# Patient Record
Sex: Female | Born: 1940 | Hispanic: No | Marital: Married | Smoking: Former smoker
Health system: Southern US, Community
[De-identification: ages and names within clinical notes are randomized; demographics above are authoritative.]

## PROBLEM LIST (undated history)

## (undated) DIAGNOSIS — E785 Hyperlipidemia, unspecified: Secondary | ICD-10-CM

## (undated) DIAGNOSIS — E079 Disorder of thyroid, unspecified: Secondary | ICD-10-CM

## (undated) HISTORY — PX: ABDOMINAL HYSTERECTOMY: SHX81

## (undated) HISTORY — PX: CHOLECYSTECTOMY: SHX55

## (undated) HISTORY — PX: TONSILLECTOMY: SUR1361

## (undated) HISTORY — PX: ADENOIDECTOMY: SUR15

## (undated) HISTORY — PX: APPENDECTOMY: SHX54

---

## 1998-11-29 ENCOUNTER — Other Ambulatory Visit: Admission: RE | Admit: 1998-11-29 | Discharge: 1998-11-29 | Payer: Self-pay | Admitting: *Deleted

## 1999-12-12 ENCOUNTER — Other Ambulatory Visit: Admission: RE | Admit: 1999-12-12 | Discharge: 1999-12-12 | Payer: Self-pay | Admitting: *Deleted

## 2001-01-15 ENCOUNTER — Other Ambulatory Visit: Admission: RE | Admit: 2001-01-15 | Discharge: 2001-01-15 | Payer: Self-pay | Admitting: *Deleted

## 2018-09-09 ENCOUNTER — Emergency Department (HOSPITAL_BASED_OUTPATIENT_CLINIC_OR_DEPARTMENT_OTHER): Payer: Medicare Other

## 2018-09-09 ENCOUNTER — Encounter (HOSPITAL_BASED_OUTPATIENT_CLINIC_OR_DEPARTMENT_OTHER): Payer: Self-pay

## 2018-09-09 ENCOUNTER — Other Ambulatory Visit: Payer: Self-pay

## 2018-09-09 ENCOUNTER — Emergency Department (HOSPITAL_BASED_OUTPATIENT_CLINIC_OR_DEPARTMENT_OTHER)
Admission: EM | Admit: 2018-09-09 | Discharge: 2018-09-09 | Disposition: A | Discharge status: Home / Self Care | Payer: Medicare Other | Attending: Emergency Medicine | Admitting: Emergency Medicine

## 2018-09-09 DIAGNOSIS — Z9049 Acquired absence of other specified parts of digestive tract: Secondary | ICD-10-CM | POA: Diagnosis not present

## 2018-09-09 DIAGNOSIS — S82144A Nondisplaced bicondylar fracture of right tibia, initial encounter for closed fracture: Secondary | ICD-10-CM | POA: Diagnosis not present

## 2018-09-09 DIAGNOSIS — Y92512 Supermarket, store or market as the place of occurrence of the external cause: Secondary | ICD-10-CM | POA: Insufficient documentation

## 2018-09-09 DIAGNOSIS — Y9389 Activity, other specified: Secondary | ICD-10-CM | POA: Insufficient documentation

## 2018-09-09 DIAGNOSIS — W01198A Fall on same level from slipping, tripping and stumbling with subsequent striking against other object, initial encounter: Secondary | ICD-10-CM | POA: Diagnosis not present

## 2018-09-09 DIAGNOSIS — Y998 Other external cause status: Secondary | ICD-10-CM | POA: Insufficient documentation

## 2018-09-09 DIAGNOSIS — Z87891 Personal history of nicotine dependence: Secondary | ICD-10-CM | POA: Diagnosis not present

## 2018-09-09 DIAGNOSIS — S8991XA Unspecified injury of right lower leg, initial encounter: Secondary | ICD-10-CM | POA: Diagnosis present

## 2018-09-09 DIAGNOSIS — S82141A Displaced bicondylar fracture of right tibia, initial encounter for closed fracture: Secondary | ICD-10-CM

## 2018-09-09 HISTORY — DX: Hyperlipidemia, unspecified: E78.5

## 2018-09-09 HISTORY — DX: Disorder of thyroid, unspecified: E07.9

## 2018-09-09 MED ORDER — ACETAMINOPHEN 500 MG PO TABS
500.0000 mg | ORAL_TABLET | Freq: Once | ORAL | Status: AC
  Administered 2018-09-09: 500 mg via ORAL
  Filled 2018-09-09: qty 1

## 2018-09-09 NOTE — ED Triage Notes (Signed)
Pt tripped over a threshold and fell to the concrete. Pt c/o injury to R knee. Denies striking her head. Denies blood thinners.

## 2018-09-09 NOTE — Discharge Instructions (Signed)
You were seen today for pain in your right knee. Please read and follow all provided instructions.  Your CT scan today shows you have a nondisplaced posterior lateral tibial fracture without depression.  1. Medications: Tylenol for pain control, usual home medications  2. Treatment: rest, ice, elevate and use brace and crutches.  3. Follow Up: Please followup with orthopedics .  Discussed your CT scan with our on-call orthopedist Dr. Lequita Halt recommend you call the office tomorrow to schedule an outpatient appointment to be seen later this week.  He recommends you wear the knee brace, use crutches and be nonweightbearing.   Return to the emergency department for any new or worsening symptoms.  This includes pain you are unable to control, swelling to the leg or the leg feeling extremely tight.

## 2018-09-09 NOTE — ED Notes (Signed)
Patient transported to CT 

## 2018-09-09 NOTE — ED Provider Notes (Signed)
[facility] EMERGENCY DEPARTMENT Provider Note   CSN: [id number] Arrival date & time: 09/09/18  1734    History   Chief Complaint Chief Complaint  Patient presents with   Knee Injury    HPI Shannon Smith is a 78 y.o. female history of hyperlipidemia and thyroid disease presenting to emergency department today with chief complaint of right knee pain after mechanical fall.  Onset was acute happening just prior to arrival.  Patient states she was walking into a store and tripped on an unlevel curb.  She fell and landed on her right knee.  The fall was witnessed by bystanders who helped her get up.  Patient was able to ambulate into the store however had severe pain and then came in for evaluation.  She states the pain is located at her kneecap, behind it, as well as down the lateral side of her right leg.  She describes the pain as sharp and shooting.  Pain is better at rest and worse with movement.  She rates the pain 10 out of 10 with movement.  She did not take anything for pain prior to arrival.  She has superficial abrasion to base of right thumb. She denies hitting her head, LOC, use of blood thinners, right ankle pain.  History provided by patient with additional history obtained from chart review.     Past Medical History:  Diagnosis Date   Hyperlipidemia    Thyroid disease     There are no active problems to display for this patient.   Past Surgical History:  Procedure Laterality Date   ABDOMINAL HYSTERECTOMY     ADENOIDECTOMY     APPENDECTOMY     CHOLECYSTECTOMY     TONSILLECTOMY       OB History   No obstetric history on file.      Home Medications    Prior to Admission medications   Not on File    Family History No family history on file.  Social History Social History   Tobacco Use   Smoking status: Former Smoker   Smokeless tobacco: Never Used  Substance Use Topics   Alcohol use: Yes    Comment: occ   Drug use: Never      Allergies   Daypro [oxaprozin]   Review of Systems Review of Systems  Constitutional: Negative for chills and fever.  Musculoskeletal: Positive for arthralgias, gait problem and joint swelling. Negative for back pain, neck pain and neck stiffness.  Skin: Positive for wound.  Allergic/Immunologic: Negative for immunocompromised state.  Neurological: Negative for dizziness, syncope, weakness, light-headedness, numbness and headaches.     Physical Exam Updated Vital Signs BP (!) 161/82 (BP Location: Left Arm)    Pulse 87    Temp 98.5 F (36.9 C) (Oral)    Resp 20    Ht 5' 1.5" (1.562 m)    Wt 63.5 kg    SpO2 96%    BMI 26.02 kg/m   Physical Exam Vitals signs and nursing note reviewed.  Constitutional:      General: She is not in acute distress.    Appearance: She is not toxic-appearing.  HENT:     Head: Normocephalic and atraumatic.     Nose: Nose normal.  Eyes:     General: No scleral icterus.    Conjunctiva/sclera: Conjunctivae normal.  Neck:     Musculoskeletal: Normal range of motion.  Cardiovascular:     Rate and Rhythm: Normal rate and regular rhythm.  Pulses: Normal pulses.          Radial pulses are 2+ on the right side and 2+ on the left side.     Heart sounds: Normal heart sounds.  Pulmonary:     Effort: Pulmonary effort is normal.     Breath sounds: Normal breath sounds.  Abdominal:     General: There is no distension.     Palpations: Abdomen is soft.     Tenderness: There is no abdominal tenderness.  Musculoskeletal:     Right hip: Normal.     Right knee: She exhibits no deformity and normal alignment. No medial joint line, no lateral joint line, no MCL and no LCL tenderness noted.     Right ankle: Normal.     Comments: Decreased range of motion including flexion and extension of right knee likely secondary to pain. No laceration, wound, ecchymosis, abrasion noted to right lower extremity. Pt has antalgic gait. Neurovascularly intact distally.  Compartments soft above and below affected joint. There is edema to lateral aspect of right knee. No bony tenderness of patella. Intact anterior and posterior drawer tests. DP plulse 2+ bilaterally. Full ROM of right ankle, non tender to palpation, able to wiggle all toes.   Skin:    General: Skin is warm and dry.     Comments: Professional abrasion to base of left thumb palmar aspect.  Bleeding is controlled.  Neurological:     Mental Status: She is alert and oriented to person, place, and time.     Comments: Sensation grossly intact to light touch in the lower extremities bilaterally. Strength 5/5 with flexion and extension at the bilateral hips, and ankles. Strength 4/5 with flexion and extension of right knee   Psychiatric:        Behavior: Behavior normal.      ED Treatments / Results  Labs (all labs ordered are listed, but only abnormal results are displayed) Labs Reviewed - No data to display  EKG None  Radiology Ct Knee Right Wo Contrast  Result Date: 09/09/2018 CLINICAL DATA:  Status post fall today.  Unable to bear weight EXAM: CT OF THE RIGHT KNEE WITHOUT CONTRAST TECHNIQUE: Multidetector CT imaging of the RIGHT knee was performed according to the standard protocol. Multiplanar CT image reconstructions were also generated. COMPARISON:  None. FINDINGS: Bones/Joint/Cartilage Generalized osteopenia. Subtle nondisplaced fracture of the posterior lateral tibial plateau without depression. Normal alignment. Large hemarthrosis. 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 Subchondral cystic changes in the lateral patellar facet. No aggressive osseous lesion. No periosteal reaction or bone destruction. Ligaments Ligaments are suboptimally evaluated by CT. Muscles and Tendons Muscles are normal. No muscle atrophy. Quadriceps tendon and patellar tendon are intact. Soft tissue No fluid collection or hematoma. No soft tissue mass. Soft tissue edema along the  anterior aspect of the patella. IMPRESSION: 1. Subtle nondisplaced fracture of the posterior lateral tibial plateau without depression. Large hemarthrosis. Electronically Signed   By: Elige Ko   On: 09/09/2018 21:23   Dg Knee Complete 4 Views Right  Result Date: 09/09/2018 CLINICAL DATA:  RIGHT knee pain and swelling, tripped over a threshold and fell onto concrete today, unable to bear weight, limited mobility EXAM: RIGHT KNEE - COMPLETE 4+ VIEW COMPARISON:  None FINDINGS: Osseous demineralization. Joint spaces preserved. Small knee joint effusion present. No acute fracture, dislocation, or bone destruction. IMPRESSION: Osseous demineralization and small knee joint effusion. No acute fracture or dislocation seen. Electronically Signed   By: Ulyses Southward  M.D.   On: 09/09/2018 18:41    Procedures Procedures (including critical care time)  Medications Ordered in ED Medications  acetaminophen (TYLENOL) tablet 500 mg (500 mg Oral Given 09/09/18 1958)     Initial Impression / Assessment and Plan / ED Course  I have reviewed the triage vital signs and the nursing notes.  Pertinent labs & imaging results that were available during my care of the patient were reviewed by me and considered in my medical decision making (see chart for details).    78 yo female presents with right leg pain after mechanical fall landing on right knee. She is nontoxic appearing, in no acute distress. Pt had right knee xray completed in triage. I viewed the xray and it is negative for any fractures or dislocations. On my exam pt is tender to palpation lateral side of right leg and has difficulty bearing weight. She can ambulate with assistance however given the significant pain there is concern for tibial plateau fracture so CT right knee ordered. Pt declines pain medication and states she only takes Tylenol for pain. Will give PO Tylenol, ice and reassess. There are no signs of compartment syndrome. Ct scan viewed by me  shows nondisplaced fracture of the posterior lateral tibial plateau without depression and large hemarthrosis. On rexamination pt's swelling and pain have improved. Informed pt of fracture, placed in knee immobilizer. She has her own crutches here in the department. Consulted on call ortho Dr. Lequita HaltAluisio who recommends brace, non weight bearing, and calling the office tomorrow to schedule appointment to be seen later this week.   Patient is hemodynamically stable, in NAD, and able to ambulate with crutches in the ED. Evaluation does not show pathology that would require ongoing emergent intervention or inpatient treatment. I explained the diagnosis to the patient. Pain has been managed and has no complaints prior to discharge. Patient is comfortable with above plan and is stable for discharge at this time. All questions were answered prior to disposition. Strict return precautions for returning to the ED were discussed. Encouraged follow up with ortho. Pt case discussed with Dr. Dalene SeltzerSchlossman who agrees with my plan.    This note was prepared with assistance of Conservation officer, historic buildingsDragon voice recognition software. Occasional wrong-word or sound-a-like substitutions may have occurred due to the inherent limitations of voice recognition software.    Final Clinical Impressions(s) / ED Diagnoses   Final diagnoses:  Tibial plateau fracture, right, closed, initial encounter    ED Discharge Orders    None       Sherene Sireslbrizze, Rose Hippler E, PA-C 09/10/18 1229    Alvira MondaySchlossman, Erin, MD 09/10/18 1534

## 2020-03-10 IMAGING — DX RIGHT KNEE - COMPLETE 4+ VIEW
4 series · 4 of 4 positions shown · non-contrast
Comparison: None

CLINICAL DATA: RIGHT knee pain and swelling, tripped over a
threshold and fell onto concrete today, unable to bear weight,
limited mobility

EXAM:
RIGHT KNEE - COMPLETE 4+ VIEW

[knee ap]
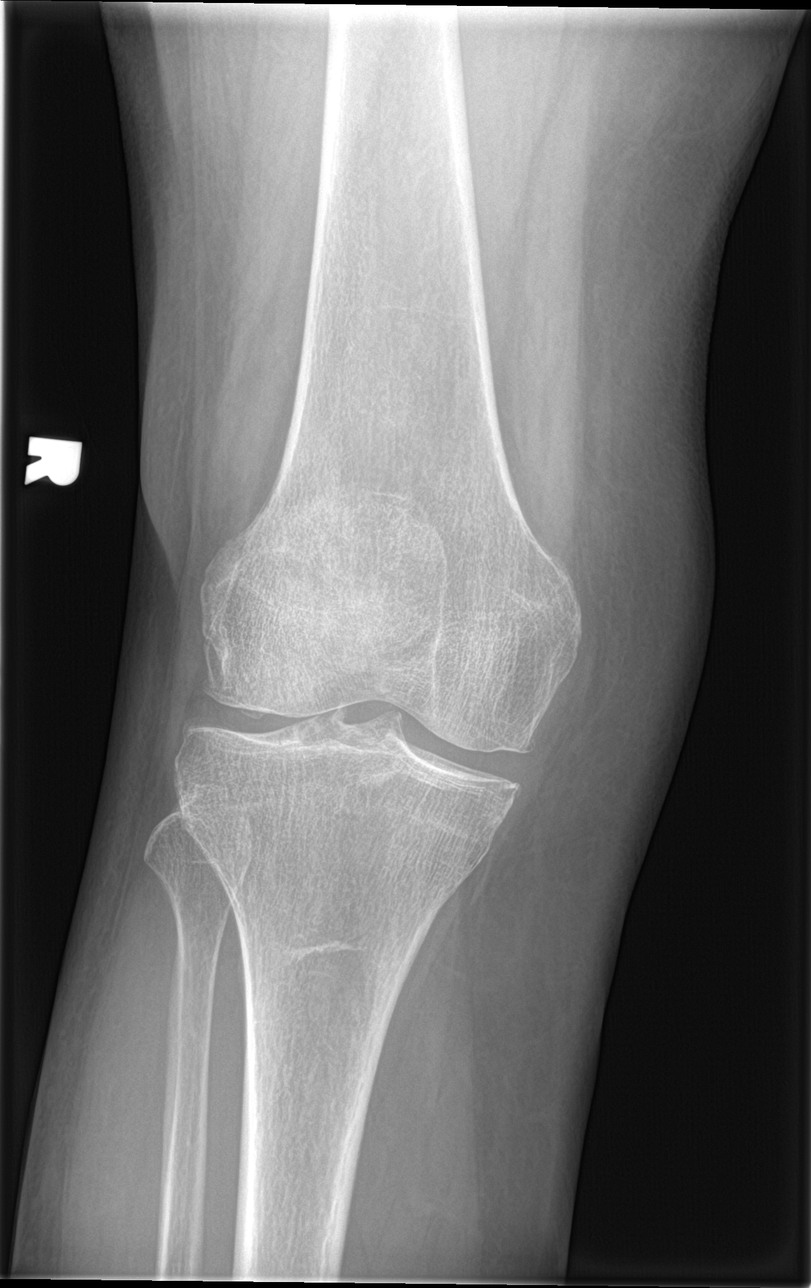

[knee obl (1 of 2)]
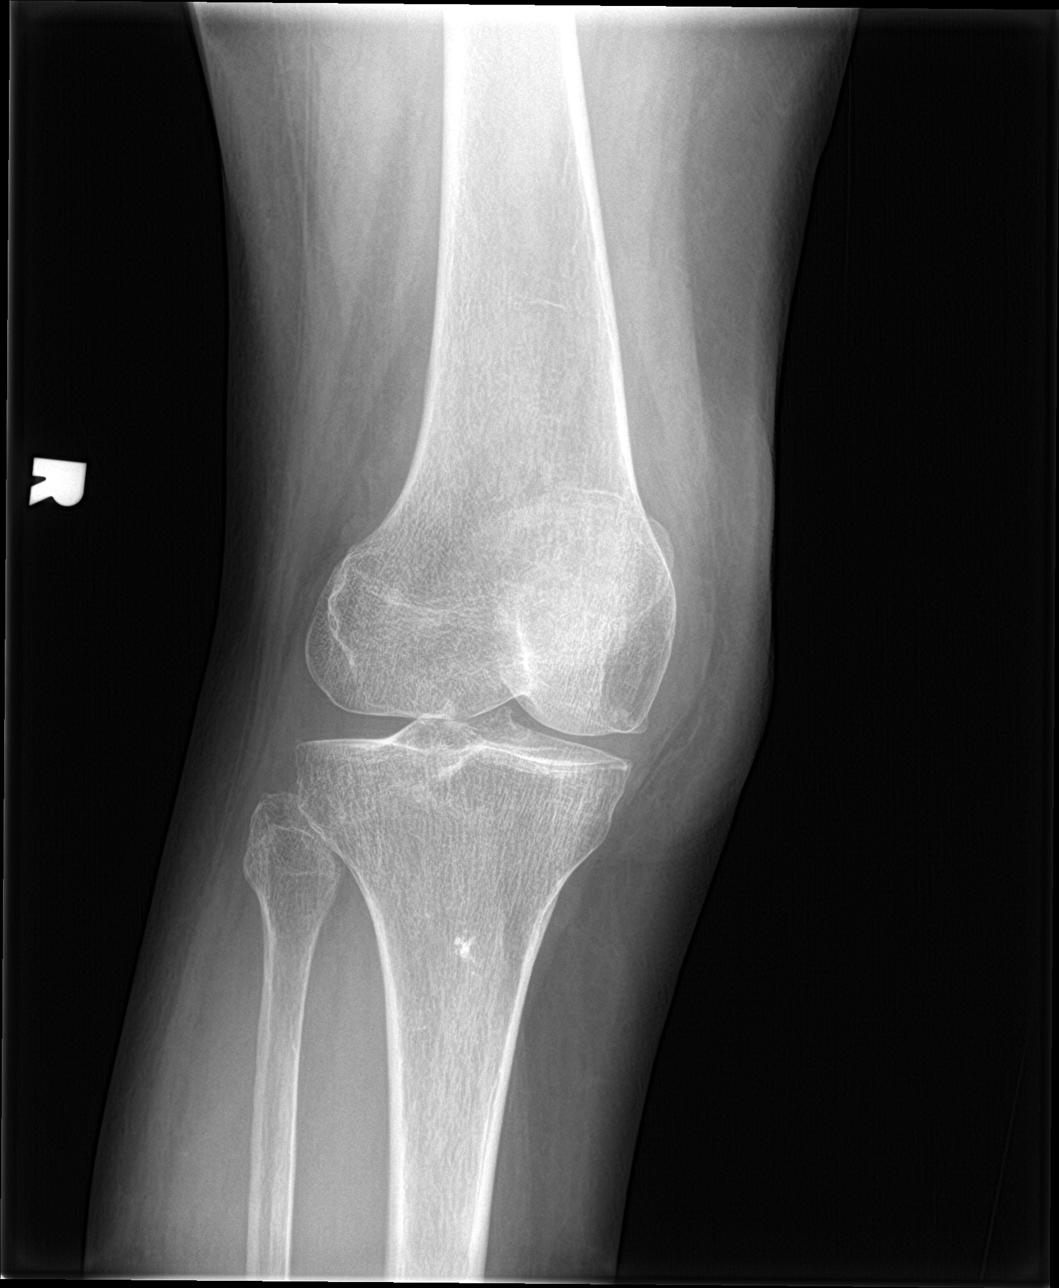

[knee lat]
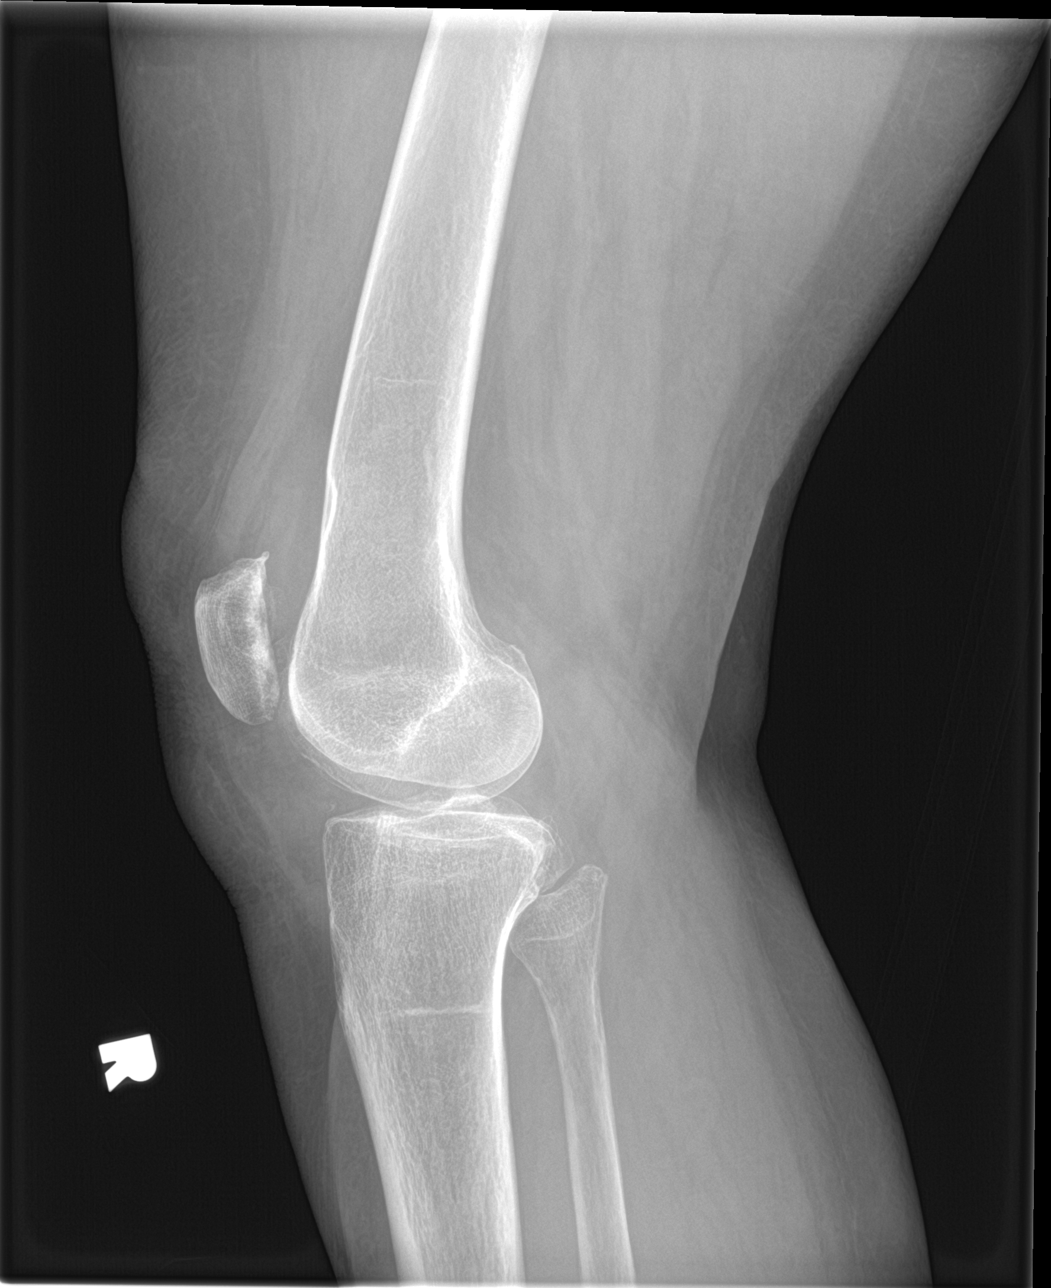

[knee obl (2 of 2)]
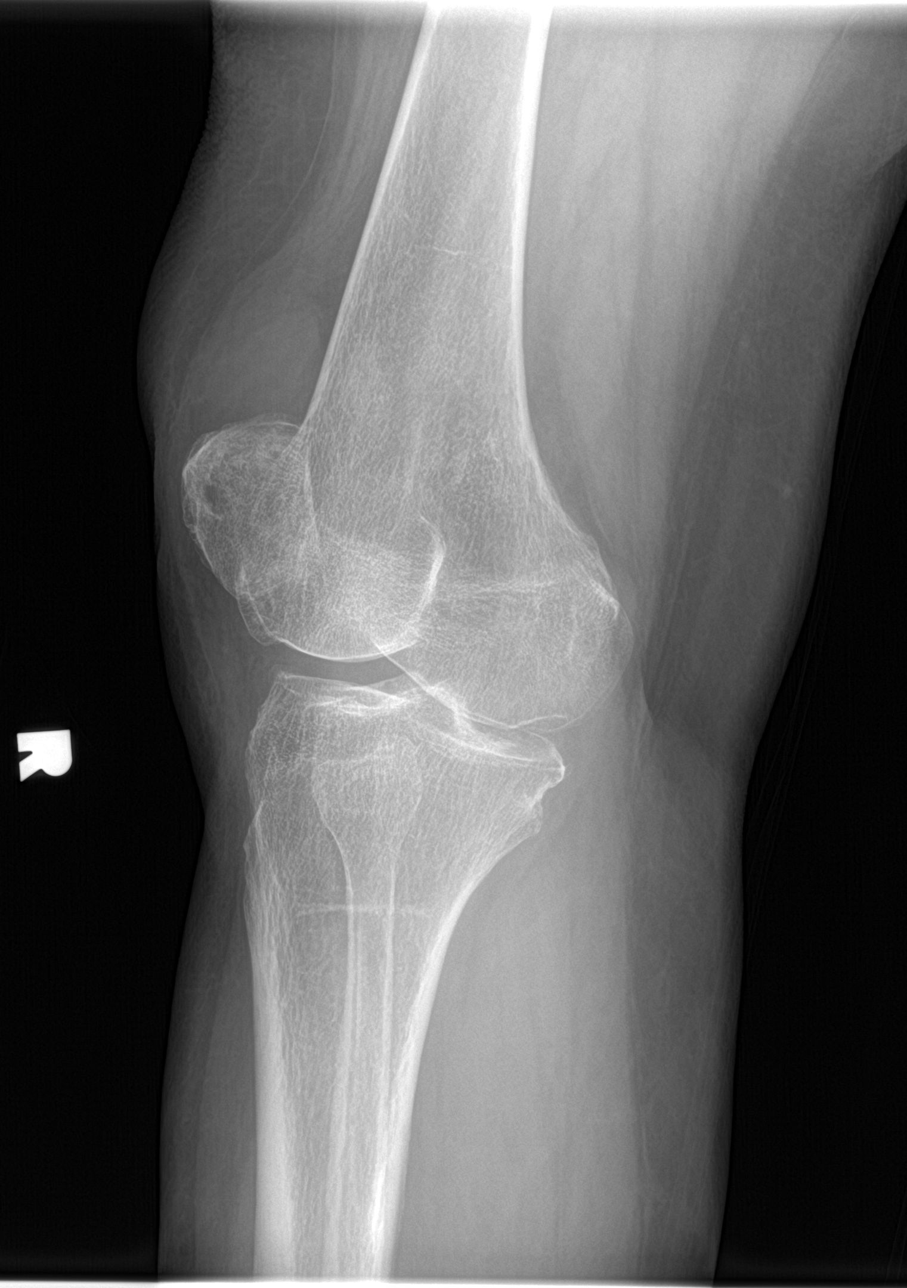

[4 of 4 positions shown; findings below may reference images not displayed]

FINDINGS: Osseous demineralization.

Joint spaces preserved.

Small knee joint effusion present.

No acute fracture, dislocation, or bone destruction.
IMPRESSION: Osseous demineralization and small knee joint effusion.

No acute fracture or dislocation seen.
